# Patient Record
Sex: Female | Born: 1955 | Race: Black or African American | Hispanic: No | Smoking: Former smoker
Health system: Southern US, Community
[De-identification: ages and names within clinical notes are randomized; demographics above are authoritative.]

## PROBLEM LIST (undated history)

## (undated) DIAGNOSIS — I10 Essential (primary) hypertension: Secondary | ICD-10-CM

---

## 2002-08-09 DIAGNOSIS — Z90710 Acquired absence of both cervix and uterus: Secondary | ICD-10-CM | POA: Insufficient documentation

## 2008-09-29 DIAGNOSIS — I10 Essential (primary) hypertension: Secondary | ICD-10-CM | POA: Insufficient documentation

## 2008-09-29 DIAGNOSIS — F329 Major depressive disorder, single episode, unspecified: Secondary | ICD-10-CM | POA: Insufficient documentation

## 2013-02-21 DIAGNOSIS — M79673 Pain in unspecified foot: Secondary | ICD-10-CM | POA: Insufficient documentation

## 2013-11-09 DIAGNOSIS — M109 Gout, unspecified: Secondary | ICD-10-CM | POA: Insufficient documentation

## 2014-01-20 DIAGNOSIS — M545 Low back pain, unspecified: Secondary | ICD-10-CM | POA: Insufficient documentation

## 2014-01-20 DIAGNOSIS — G8929 Other chronic pain: Secondary | ICD-10-CM | POA: Insufficient documentation

## 2015-06-05 ENCOUNTER — Ambulatory Visit: Payer: Self-pay | Admitting: Podiatry

## 2015-06-08 DIAGNOSIS — J45909 Unspecified asthma, uncomplicated: Secondary | ICD-10-CM | POA: Insufficient documentation

## 2015-06-19 ENCOUNTER — Ambulatory Visit: Payer: Self-pay | Admitting: Podiatry

## 2015-06-26 ENCOUNTER — Encounter: Payer: Self-pay | Admitting: Podiatry

## 2015-06-26 ENCOUNTER — Ambulatory Visit: Payer: Self-pay

## 2015-07-03 NOTE — Progress Notes (Signed)
This encounter was created in error - please disregard.

## 2015-07-10 ENCOUNTER — Encounter: Payer: Self-pay | Admitting: Podiatry

## 2015-07-10 ENCOUNTER — Ambulatory Visit (INDEPENDENT_AMBULATORY_CARE_PROVIDER_SITE_OTHER): Payer: Federal, State, Local not specified - PPO | Admitting: Podiatry

## 2015-07-10 ENCOUNTER — Ambulatory Visit (INDEPENDENT_AMBULATORY_CARE_PROVIDER_SITE_OTHER): Payer: Federal, State, Local not specified - PPO

## 2015-07-10 VITALS — BP 111/68 | HR 83 | Resp 16

## 2015-07-10 DIAGNOSIS — M2011 Hallux valgus (acquired), right foot: Secondary | ICD-10-CM

## 2015-07-10 DIAGNOSIS — M205X1 Other deformities of toe(s) (acquired), right foot: Secondary | ICD-10-CM | POA: Diagnosis not present

## 2015-07-10 DIAGNOSIS — M79671 Pain in right foot: Secondary | ICD-10-CM

## 2015-07-10 NOTE — Patient Instructions (Signed)
Pre-Operative Instructions  Congratulations, you have decided to take an important step to improving your quality of life.  You can be assured that the doctors of [facility] will be with you every step of the way.  1. Plan to be at the surgery center/hospital at least 1 (one) hour prior to your scheduled time unless otherwise directed by the surgical center/hospital staff.  You must have a responsible adult accompany you, remain during the surgery and drive you home.  Make sure you have directions to the surgical center/hospital and know how to get there on time. 2. For hospital based surgery you will need to obtain a history and physical form from your family physician within 1 month prior to the date of surgery- we will give you a form for you primary physician.  3. We make every effort to accommodate the date you request for surgery.  There are however, times where surgery dates or times have to be moved.  We will contact you as soon as possible if a change in schedule is required.   4. No Aspirin/Ibuprofen for one week before surgery.  If you are on aspirin, any non-steroidal anti-inflammatory medications (Mobic, Aleve, Ibuprofen) you should stop taking it 7 days prior to your surgery.  You make take Tylenol  For pain prior to surgery.  5. Medications- If you are taking daily heart and blood pressure medications, seizure, reflux, allergy, asthma, anxiety, pain or diabetes medications, make sure the surgery center/hospital is aware before the day of surgery so they may notify you which medications to take or avoid the day of surgery. 6. No food or drink after midnight the night before surgery unless directed otherwise by surgical center/hospital staff. 7. No alcoholic beverages 24 hours prior to surgery.  No smoking 24 hours prior to or 24 hours after surgery. 8. Wear loose pants or shorts- loose enough to fit over bandages, boots, and casts. 9. No slip on shoes, sneakers are best. 10. Bring  your boot with you to the surgery center/hospital.  Also bring crutches or a walker if your physician has prescribed it for you.  If you do not have this equipment, it will be provided for you after surgery. 11. If you have not been contracted by the surgery center/hospital by the day before your surgery, call to confirm the date and time of your surgery. 12. Leave-time from work may vary depending on the type of surgery you have.  Appropriate arrangements should be made prior to surgery with your employer. 13. Prescriptions will be provided immediately following surgery by your doctor.  Have these filled as soon as possible after surgery and take the medication as directed. 14. Remove nail polish on the operative foot. 15. Wash the night before surgery.  The night before surgery wash the foot and leg well with the antibacterial soap provided and water paying special attention to beneath the toenails and in between the toes.  Rinse thoroughly with water and dry well with a towel.  Perform this wash unless told not to do so by your physician.  Enclosed: 1 Ice pack (please put in freezer the night before surgery)   1 Hibiclens skin cleaner   Pre-op Instructions  If you have any questions regarding the instructions, do not hesitate to call our office.  [place]: [address] , [place] [address] [phone]  [place]: [address] [place], [place] [address] [phone]  [place]: [address]  [place], [place] [address] [phone]  Dr. Richard   Tuchman DPM, Dr. Norman Regal DPM Dr. Richard Sikora DPM, Dr. M. Todd Hyatt DPM, Dr. Kathryn Egerton DPM 

## 2015-07-10 NOTE — Progress Notes (Signed)
   Subjective:    Patient ID: Sara Patterson, female    DOB: May 19, 1956, 59 y.o.   MRN: [id number]  HPI: She presents today 59 year old female with chief complaint of pain to the right first metatarsophalangeal joint. She states this been bothering me for several years and seems to be getting worse. It bothers her with shoe gear and states that it inhibits her ability to perform her daily activities.    Review of Systems  Musculoskeletal: Positive for back pain and arthralgias.  All other systems reviewed and are negative.      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 Pulses are strongly palpable bilateral. Neurologic sensorium is intact per Semmes-Weinstein monofilament. Deep tendon reflexes are intact bilateral and muscle strength +5 over 5 dorsiflexion plantar flexors and inverters and everters all intrinsic musculature is intact. Orthopedic evaluation and states all joints distal to the ankle held full range of motion without crepitation with exception of the first metatarsophalangeal joint of the right foot. At this point her first metatarsophalangeal joint is limited in dorsiflexion and she has pain on palpation around the joint. Radiographs taken today in the office demonstrates hallux limitus with dorsal spurring subchondral sclerosis joint space narrowing and a very irregular cartilaginous surface to the head of the first metatarsal. Cutaneous evaluation demonstrates supple well-hydrated cutis no erythema edema cellulitis drainage or odor.        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  Plan: Discussed etiology pathology conservative versus surgical therapies. We discussed appropriate shoe gear stretching exercise ice therapy shoe modifications and all for her  injection. She declined the injection and is requesting surgical intervention. At this point we went over a consent form today line by line number by number giving her ample time to ask questions she saw fit regarding a Keller arthroplasty with single silicone implant and grommets. I answered all the questions regarding this procedure to the best of my ability in layman's terms. She understood it was amenable to it and some of the fascia the consent form. We will also went over possible postoperative complications which may include but are not limited to postop pain bleeding swelling infection recurrence need for further surgery development of a DVT or pulmonary embolism she understands this. She has a cam walker at home which she will bring with her date of surgery. I will follow-up with her in the near future for surgical intervention.

## 2015-07-20 ENCOUNTER — Telehealth: Payer: Self-pay | Admitting: *Deleted

## 2015-07-20 NOTE — Telephone Encounter (Signed)
I'm the surgical coordinator.  Dr. Al Corpus wanted me to give you a call to schedule surgery.  "I'm sure he did.  Let's do it on December 9th."  Okay, that date is available.  Whenever you get a chance go ahead and register with the Surgical Center.  They will call you a day or two prior to surgery date.  They will give you the exact arrival time.  "Okay, thanks for calling.  Have a great weekend."  You as well.

## 2015-09-03 ENCOUNTER — Encounter (HOSPITAL_COMMUNITY): Payer: Self-pay | Admitting: Emergency Medicine

## 2015-09-03 ENCOUNTER — Emergency Department (HOSPITAL_COMMUNITY)
Admission: EM | Admit: 2015-09-03 | Discharge: 2015-09-03 | Disposition: A | Discharge status: Home / Self Care | Payer: Federal, State, Local not specified - PPO | Attending: Emergency Medicine | Admitting: Emergency Medicine

## 2015-09-03 ENCOUNTER — Emergency Department (HOSPITAL_COMMUNITY): Payer: Federal, State, Local not specified - PPO

## 2015-09-03 DIAGNOSIS — Z79899 Other long term (current) drug therapy: Secondary | ICD-10-CM | POA: Diagnosis not present

## 2015-09-03 DIAGNOSIS — Z87891 Personal history of nicotine dependence: Secondary | ICD-10-CM | POA: Insufficient documentation

## 2015-09-03 DIAGNOSIS — Z7951 Long term (current) use of inhaled steroids: Secondary | ICD-10-CM | POA: Insufficient documentation

## 2015-09-03 DIAGNOSIS — R109 Unspecified abdominal pain: Secondary | ICD-10-CM | POA: Insufficient documentation

## 2015-09-03 DIAGNOSIS — I10 Essential (primary) hypertension: Secondary | ICD-10-CM | POA: Diagnosis not present

## 2015-09-03 HISTORY — DX: Essential (primary) hypertension: I10

## 2015-09-03 LAB — URINALYSIS, ROUTINE W REFLEX MICROSCOPIC
BILIRUBIN URINE: NEGATIVE
Glucose, UA: NEGATIVE mg/dL
HGB URINE DIPSTICK: NEGATIVE
Ketones, ur: NEGATIVE mg/dL
Leukocytes, UA: NEGATIVE
NITRITE: NEGATIVE
PROTEIN: NEGATIVE mg/dL
SPECIFIC GRAVITY, URINE: 1.005 (ref 1.005–1.030)
UROBILINOGEN UA: 0.2 mg/dL (ref 0.0–1.0)
pH: 7 (ref 5.0–8.0)

## 2015-09-03 LAB — BASIC METABOLIC PANEL
Anion gap: 7 (ref 5–15)
BUN: 15 mg/dL (ref 6–20)
CHLORIDE: 102 mmol/L (ref 101–111)
CO2: 29 mmol/L (ref 22–32)
CREATININE: 0.82 mg/dL (ref 0.44–1.00)
Calcium: 9.1 mg/dL (ref 8.9–10.3)
Glucose, Bld: 108 mg/dL — ABNORMAL HIGH (ref 65–99)
Potassium: 3.7 mmol/L (ref 3.5–5.1)
SODIUM: 138 mmol/L (ref 135–145)

## 2015-09-03 LAB — CBC
HCT: 37.8 % (ref 36.0–46.0)
Hemoglobin: 12.7 g/dL (ref 12.0–15.0)
MCH: 29.4 pg (ref 26.0–34.0)
MCHC: 33.6 g/dL (ref 30.0–36.0)
MCV: 87.5 fL (ref 78.0–100.0)
PLATELETS: 192 10*3/uL (ref 150–400)
RBC: 4.32 MIL/uL (ref 3.87–5.11)
RDW: 13.1 % (ref 11.5–15.5)
WBC: 4.3 10*3/uL (ref 4.0–10.5)

## 2015-09-03 MED ORDER — MORPHINE SULFATE (PF) 4 MG/ML IV SOLN
4.0000 mg | Freq: Once | INTRAVENOUS | Status: AC
  Administered 2015-09-03: 4 mg via INTRAVENOUS
  Filled 2015-09-03: qty 1

## 2015-09-03 MED ORDER — KETOROLAC TROMETHAMINE 30 MG/ML IJ SOLN
30.0000 mg | Freq: Once | INTRAMUSCULAR | Status: AC
  Administered 2015-09-03: 30 mg via INTRAVENOUS
  Filled 2015-09-03: qty 1

## 2015-09-03 MED ORDER — IOHEXOL 300 MG/ML  SOLN
100.0000 mL | Freq: Once | INTRAMUSCULAR | Status: AC | PRN
  Administered 2015-09-03: 100 mL via INTRAVENOUS

## 2015-09-03 MED ORDER — SODIUM CHLORIDE 0.9 % IV SOLN
1000.0000 mL | Freq: Once | INTRAVENOUS | Status: AC
  Administered 2015-09-03: 1000 mL via INTRAVENOUS

## 2015-09-03 MED ORDER — ONDANSETRON HCL 4 MG/2ML IJ SOLN
4.0000 mg | Freq: Once | INTRAMUSCULAR | Status: AC
  Administered 2015-09-03: 4 mg via INTRAVENOUS
  Filled 2015-09-03: qty 2

## 2015-09-03 MED ORDER — OXYCODONE-ACETAMINOPHEN 5-325 MG PO TABS: 1.0000 | ORAL_TABLET | ORAL | Status: DC | PRN

## 2015-09-03 MED ORDER — IOHEXOL 300 MG/ML  SOLN
25.0000 mL | Freq: Once | INTRAMUSCULAR | Status: AC | PRN
  Administered 2015-09-03: 25 mL via ORAL

## 2015-09-03 NOTE — ED Notes (Signed)
Per pt states right flank pain with no urinary symptoms since Wednesday

## 2015-09-03 NOTE — ED Notes (Signed)
Patient transported to CT 

## 2015-09-03 NOTE — ED Notes (Signed)
Bed: WA07 Expected date:  Expected time:  Means of arrival:  Comments: 

## 2015-09-03 NOTE — ED Provider Notes (Signed)
CSN: [id number]     Arrival date & time 09/03/15  1038 History   First MD Initiated Contact with Patient 09/03/15 1108     Chief Complaint  Patient presents with  . Flank Pain     (Consider location/radiation/quality/duration/timing/severity/associated sxs/prior Treatment) HPI Sara Patterson This 59 year old female who presents emergency Department with chief complaint of right flank pain. Patient states that symptoms began 5 days ago. However, she has been dealing with significant familial stressors and a sister who died. Patient states that her pain is constant, colicky, currently 8 out of 10, nothing seems to make it worse or better. She has no urinary symptoms or history of kidney stone. She denies fevers, chills, myalgias or other signs of systemic infection. Past Medical History  Diagnosis Date  . Hypertension    History reviewed. No pertinent past surgical history. No family history on file. Social History  Substance Use Topics  . Smoking status: Former Games developer  . Smokeless tobacco: None  . Alcohol Use: 0.0 oz/week    0 Standard drinks or equivalent per week   OB History    No data available     Review of Systems  Ten systems reviewed and are negative for acute change, except as noted in the HPI.    Allergies  Review of patient's allergies indicates no known allergies.  Home Medications   Prior to Admission medications   Medication Sig Start Date End Date Taking? Authorizing Provider  albuterol (PROVENTIL HFA;VENTOLIN HFA) 108 (90 BASE) MCG/ACT inhaler Inhale 1-2 puffs into the lungs every 6 (six) hours as needed for wheezing or shortness of breath.   Yes Historical Provider, MD  ALPRAZolam Prudy Feeler) 0.5 MG tablet Take 0.25 mg by mouth 2 (two) times daily as needed for anxiety.  07/23/15  Yes Historical Provider, MD  buPROPion (WELLBUTRIN XL) 150 MG 24 hr tablet Take 150 mg by mouth daily.  02/16/15  Yes Historical Provider, MD  cetirizine (ZYRTEC) 10 MG tablet Take 10 mg  by mouth daily.  06/08/15  Yes Historical Provider, MD  cyclobenzaprine (FLEXERIL) 10 MG tablet Take 10 mg by mouth 3 (three) times daily as needed for muscle spasms.  08/20/15  Yes Historical Provider, MD  FLUoxetine (PROZAC) 20 MG capsule Take 20 mg by mouth daily.  02/16/15  Yes Historical Provider, MD  Fluticasone Propionate, Inhal, (FLOVENT IN) Inhale 1 puff into the lungs daily.   Yes Historical Provider, MD  hydrochlorothiazide (HYDRODIURIL) 25 MG tablet Take 25 mg by mouth daily.  02/16/15  Yes Historical Provider, MD  JUBLIA 10 % SOLN Apply 1 application topically as needed (fungus). Fungus 08/28/15  Yes Historical Provider, MD  lisinopril (PRINIVIL,ZESTRIL) 10 MG tablet Take 10 mg by mouth daily.  02/16/15  Yes Historical Provider, MD  naproxen (NAPROSYN) 500 MG tablet Take 500 mg by mouth 2 (two) times daily as needed for mild pain.  08/20/15  Yes Historical Provider, MD  oxyCODONE-acetaminophen (PERCOCET) 5-325 MG tablet Take 1-2 tablets by mouth every 4 (four) hours as needed. 09/03/15   Tammie Yanda, PA-C   BP 128/69 mmHg  Pulse 76  Temp(Src) 97.8 F (36.6 C) (Oral)  Resp 16  SpO2 100% Physical Exam  Constitutional: She is oriented to person, place, and time. She appears well-developed and well-nourished. No distress.  HENT:  Head: Normocephalic and atraumatic.  Eyes: Conjunctivae are normal. No scleral icterus.  Neck: Normal range of motion.  Cardiovascular: Normal rate, regular rhythm and normal heart sounds.  Exam reveals no gallop  and no friction rub.   No murmur heard. Pulmonary/Chest: Effort normal and breath sounds normal. No respiratory distress.  Abdominal: Soft. Bowel sounds are normal. She exhibits no distension and no mass. There is tenderness (R CVA TENDERNESS). There is no guarding.  Neurological: She is alert and oriented to person, place, and time.  Skin: Skin is warm and dry. She is not diaphoretic.  Nursing note and vitals reviewed.   ED Course  Procedures  (including critical care time) Labs Review Labs Reviewed  BASIC METABOLIC PANEL - Abnormal; Notable for the following:    Glucose, Bld 108 (*)    All other components within normal limits  URINALYSIS, ROUTINE W REFLEX MICROSCOPIC (NOT AT [facility])  CBC    Imaging Review Ct Abdomen Pelvis W Contrast  09/03/2015  CLINICAL DATA:  Right flank pain EXAM: CT ABDOMEN AND PELVIS WITH CONTRAST TECHNIQUE: Multidetector CT imaging of the abdomen and pelvis was performed using the standard protocol following bolus administration of intravenous contrast. CONTRAST:  100mL OMNIPAQUE IOHEXOL 300 MG/ML  SOLN COMPARISON:  None FINDINGS: The lung bases are unremarkable. Sagittal images of the spine shows degenerative changes lower thoracic spine. Enhanced liver is unremarkable. There is a low density lesion within spleen with ring like peripheral calcification probable a cyst measures 2.5 cm The pancreas and adrenal glands are unremarkable. Kidneys are symmetrical in size and enhancement. No hydronephrosis or hydroureter. No aortic aneurysm. Moderate stool noted throughout the colon. No small bowel obstruction. No ascites or free air. No adenopathy. There is no pericecal inflammation. Normal appendix. There is a distended urinary bladder. No calcified calculi are noted within urinary bladder. The uterus and adnexa are not identified. Moderate stool noted within rectum. No inguinal adenopathy. No destructive bony lesions are noted within pelvis. Delayed renal images shows bilateral renal symmetrical excretion. IMPRESSION: 1. There is no acute inflammatory process within abdomen or pelvis. 2. Low-density lesion with peripheral calcification within spleen measures 2.5 cm probable is cyst. 3. Normal appendix. No pericecal inflammation. Moderate stool throughout the colon. 4. There is a distended urinary bladder. No calcified calculi are noted within urinary bladder. 5. The uterus and adnexa not identified probable surgically  absent. 6. No small bowel obstruction.  No hydronephrosis or hydroureter. Electronically Signed   By: Natasha MeadLiviu  Pop M.D.   On: 09/03/2015 13:59   I have personally reviewed and evaluated these images and lab results as part of my medical decision-making.   EKG Interpretation None      MDM   Final diagnoses:  Right flank pain  .3:16 PM Filed Vitals:   09/03/15 1050 09/03/15 1304 09/03/15 1437  BP: 123/68 132/64 128/69  Pulse: 93 65 76  Temp: 97.8 F (36.6 C)  97.8 F (36.6 C)  TempSrc: Oral  Oral  Resp: 18 17 16   SpO2: 99% 100% 100%    Patient here with complaint of flank pain. No abnormalities on lab work. 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 However, there is no acute abnormality to account for her pain.  Pain is not reproducible. She is not having pleuritic pain.Pain is controlled with pain medication.. Labs are unremarkable. Patient states that splenic lesion was noted on a previous scan. Will, dc with pain medication.patient is to f/u with her pcp asap.   Arthor Captainbigail Jream Broyles, PA-C 09/07/15 [id number]  Raeford RazorStephen Kohut, MD 09/10/15 [phone]

## 2015-09-03 NOTE — Discharge Instructions (Signed)
Tramadol cannot be taken with your antidepressant medication.  I have prescribed a small amount of percocet for relief of severe pain.  Flank Pain Flank pain refers to pain that is located on the side of the body between the upper abdomen and the back. The pain may occur over a short period of time (acute) or may be long-term or reoccurring (chronic). It may be mild or severe. Flank pain can be caused by many things. CAUSES  Some of the more common causes of flank pain include:  Muscle strains.   Muscle spasms.   A disease of your spine (vertebral disk disease).   A lung infection (pneumonia).   Fluid around your lungs (pulmonary edema).   A kidney infection.   Kidney stones.   A very painful skin rash caused by the chickenpox virus (shingles).   Gallbladder disease.  HOME CARE INSTRUCTIONS  Home care will depend on the cause of your pain. In general,  Rest as directed by your caregiver.  Drink enough fluids to keep your urine clear or pale yellow.  Only take over-the-counter or prescription medicines as directed by your caregiver. Some medicines may help relieve the pain.  Tell your caregiver about any changes in your pain.  Follow up with your caregiver as directed. SEEK IMMEDIATE MEDICAL CARE IF:   Your pain is not controlled with medicine.   You have new or worsening symptoms.  Your pain increases.   You have abdominal pain.   You have shortness of breath.   You have persistent nausea or vomiting.   You have swelling in your abdomen.   You feel faint or pass out.   You have blood in your urine.  You have a fever or persistent symptoms for more than 2-3 days.  You have a fever and your symptoms suddenly get worse. MAKE SURE YOU:   Understand these instructions.  Will watch your condition.  Will get help right away if you are not doing well or get worse.   This information is not intended to replace advice given to you by your  health care provider. Make sure you discuss any questions you have with your health care provider.   Document Released: 12/18/2005 Document Revised: 07/21/2012 Document Reviewed: 06/10/2012 Elsevier Interactive Patient Education [employer]2016 [employer] Inc.

## 2015-10-18 ENCOUNTER — Other Ambulatory Visit: Payer: Self-pay | Admitting: Podiatry

## 2015-10-18 MED ORDER — OXYCODONE-ACETAMINOPHEN 10-325 MG PO TABS: ORAL_TABLET | ORAL | Status: DC

## 2015-10-18 MED ORDER — CEPHALEXIN 500 MG PO CAPS: 500.0000 mg | ORAL_CAPSULE | Freq: Three times a day (TID) | ORAL | Status: DC

## 2015-10-18 MED ORDER — PROMETHAZINE HCL 25 MG PO TABS: 25.0000 mg | ORAL_TABLET | Freq: Three times a day (TID) | ORAL | Status: DC | PRN

## 2015-10-19 ENCOUNTER — Encounter: Payer: Self-pay | Admitting: Podiatry

## 2015-10-19 DIAGNOSIS — M205X1 Other deformities of toe(s) (acquired), right foot: Secondary | ICD-10-CM | POA: Diagnosis not present

## 2015-10-19 DIAGNOSIS — M2011 Hallux valgus (acquired), right foot: Secondary | ICD-10-CM | POA: Diagnosis not present

## 2015-10-22 ENCOUNTER — Telehealth: Payer: Self-pay | Admitting: *Deleted

## 2015-10-22 NOTE — Telephone Encounter (Signed)
Pt states she is having problem with her surgery boot, it rubs across the suture line.  I told pt to come in and we would refit her boot.  Pt states she will be in on Wednesday.

## 2015-10-25 ENCOUNTER — Ambulatory Visit (INDEPENDENT_AMBULATORY_CARE_PROVIDER_SITE_OTHER): Payer: Federal, State, Local not specified - PPO | Admitting: Podiatry

## 2015-10-25 ENCOUNTER — Encounter: Payer: Self-pay | Admitting: Podiatry

## 2015-10-25 ENCOUNTER — Ambulatory Visit (INDEPENDENT_AMBULATORY_CARE_PROVIDER_SITE_OTHER): Payer: Federal, State, Local not specified - PPO

## 2015-10-25 VITALS — BP 137/78 | HR 97 | Resp 16

## 2015-10-25 DIAGNOSIS — Z9889 Other specified postprocedural states: Secondary | ICD-10-CM | POA: Diagnosis not present

## 2015-10-25 DIAGNOSIS — M205X1 Other deformities of toe(s) (acquired), right foot: Secondary | ICD-10-CM | POA: Diagnosis not present

## 2015-10-25 MED ORDER — HYDROXYZINE HCL 25 MG PO TABS: 25.0000 mg | ORAL_TABLET | Freq: Three times a day (TID) | ORAL | Status: AC | PRN

## 2015-10-25 MED ORDER — OXYCODONE-ACETAMINOPHEN 10-325 MG PO TABS: ORAL_TABLET | ORAL | Status: DC

## 2015-10-28 NOTE — Progress Notes (Signed)
She presents today for her first postop visit that is post Lorenz CoasterKeller arthroplasty with a single silicone implant and grommets right foot. She denies fever chills nausea vomiting muscle aches and pains area she states that the foot is moderately tender. She states that she fell last week but was able to keep the foot in the air as she injured her left knee. She states that she did not hit the right foot.  She states that she has been performing exercises to her toes to keep them moving.  Objective: Vital signs are stable she is alert and oriented 3. Dry sterile dressing intact reveals some serosanguineous drainage at the distal most aspect of the incision site. The incision site is somewhat macerated distally but is not open. She has plantarflexion of the first metatarsophalangeal joint and hallux. Either due to a extensor hallucis longus tendon tear or the edema I am unable to palpate the extensor hallucis longus tendon. I'm concerned that this tendon may be torn or partially torn resulting in the plantar flexion of the joint. This tendon had an area of fraying intraoperatively which I try to oversew and repair. radiographically the Lorenz CoasterKeller appears to be in good position with first metatarsophalangeal joint in good alignment and the implant intact. Radiographs were reviewed today with the patient as well as.  Assessment: one-week status post L arthroplasty with a single silicone implant. Possible tear or rupture of the extensor hallucis longus tendon right.   Plan: we redressed today with a dry sterile compressive dressing. I would like to get rid of the edema on the dorsal aspect of the foot to better evaluate the musculature and tendon that is still intact. I encouraged range of motion exercises I will follow up with her in 1 week for reevaluation. It maybe necessary to go back for a EHL evaluation and repair repair if  Needed if this has not resolved.

## 2015-11-01 ENCOUNTER — Ambulatory Visit (INDEPENDENT_AMBULATORY_CARE_PROVIDER_SITE_OTHER): Payer: Federal, State, Local not specified - PPO | Admitting: Podiatry

## 2015-11-01 ENCOUNTER — Encounter: Payer: Self-pay | Admitting: Podiatry

## 2015-11-01 VITALS — BP 112/62 | HR 88 | Resp 16

## 2015-11-01 DIAGNOSIS — M205X1 Other deformities of toe(s) (acquired), right foot: Secondary | ICD-10-CM

## 2015-11-01 DIAGNOSIS — Z9889 Other specified postprocedural states: Secondary | ICD-10-CM

## 2015-11-05 ENCOUNTER — Other Ambulatory Visit: Payer: Self-pay | Admitting: Podiatry

## 2015-11-05 MED ORDER — PROMETHAZINE HCL 25 MG PO TABS: 25.0000 mg | ORAL_TABLET | Freq: Three times a day (TID) | ORAL | Status: DC | PRN

## 2015-11-05 MED ORDER — HYDROMORPHONE HCL 4 MG PO TABS: ORAL_TABLET | ORAL | Status: DC

## 2015-11-05 MED ORDER — CEPHALEXIN 500 MG PO CAPS: 500.0000 mg | ORAL_CAPSULE | Freq: Three times a day (TID) | ORAL | Status: DC

## 2015-11-05 NOTE — Progress Notes (Signed)
She presents today for a follow-up of her Lorenz CoasterKeller arthroplasty with a single silicone implant. She states that it feels much better however still have no motion or dorsiflexion of the deep toe on the right foot. She states the swelling has gone down a lot.  Objective: Vital signs are stable she is alert and oriented 3. Swelling has reduced considerably there is no erythema saline as drainage or odor. Margins are well coapted at this point. She has no appreciable extension at the level of the metatarsophalangeal joint. This indicates to me that there is a tear or an attenuation of her extensor tendon. I am able to feel the extensor hallucis longus muscle fire when she tries to move the toe so I do not feel that this is a muscle problem either associated with nerve block or tourniquet. I do think that this should give back to the operating room for exploration.  Assessment: Well-healing Keller arthroplasty with a single silicone implant with great range of motion. 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  Plan: She was consented today for a surgical exploration and repair if necessary extensor hallucis longus tendon right foot. I will follow-up with her next Thursday. We went over the consent form today line by line number by number giving her ample time to ask questions she socket regarding this procedure I answered all of them to the best of my ability in layman's terms. She understands this and is amenable to it and she is educated and medicine. We did discuss the possible postop complications which may include but are not limited to postop pain bleeding swelling infection recurrence need for further surgery loss of the ability to extend the hallux loss of digit loss of limb loss of life. She will return be placed in a brace or Cam Walker at the day of surgery.

## 2015-11-06 NOTE — Progress Notes (Signed)
DOS 10/19/2015 - Right Keller Bunion Implant.

## 2015-11-08 ENCOUNTER — Encounter: Payer: Self-pay | Admitting: Podiatry

## 2015-11-08 DIAGNOSIS — M205X1 Other deformities of toe(s) (acquired), right foot: Secondary | ICD-10-CM | POA: Diagnosis not present

## 2015-11-15 ENCOUNTER — Encounter: Payer: Self-pay | Admitting: Podiatry

## 2015-11-15 ENCOUNTER — Ambulatory Visit (INDEPENDENT_AMBULATORY_CARE_PROVIDER_SITE_OTHER): Payer: Federal, State, Local not specified - PPO | Admitting: Podiatry

## 2015-11-15 VITALS — BP 113/72 | HR 85 | Temp 96.6°F | Resp 16

## 2015-11-15 DIAGNOSIS — Z9889 Other specified postprocedural states: Secondary | ICD-10-CM

## 2015-11-15 MED ORDER — HYDROMORPHONE HCL 4 MG PO TABS: ORAL_TABLET | ORAL | Status: DC

## 2015-11-15 NOTE — Progress Notes (Signed)
She presents today after revision right foot. Extensor hallucis longus tendon repair right. She states that she is doing pretty well however she is tired of the cast.  Objective: Vital signs are stable she is alert and oriented 3. She is able to move her toes with dorsiflexion and plantarflexion without complication. The cast is intact there is no posterior knee pain no shortness of breath.  Assessment: Well-healing surgical foot right.  Plan: Removal of the cast next week possible application of another cast.

## 2015-11-22 ENCOUNTER — Ambulatory Visit (INDEPENDENT_AMBULATORY_CARE_PROVIDER_SITE_OTHER): Payer: Federal, State, Local not specified - PPO | Admitting: Podiatry

## 2015-11-22 DIAGNOSIS — Z9889 Other specified postprocedural states: Secondary | ICD-10-CM

## 2015-11-22 MED ORDER — IBUPROFEN 800 MG PO TABS: 800.0000 mg | ORAL_TABLET | Freq: Three times a day (TID) | ORAL | Status: AC | PRN

## 2015-11-22 MED ORDER — HYDROMORPHONE HCL 4 MG PO TABS: ORAL_TABLET | ORAL | Status: DC

## 2015-11-22 NOTE — Progress Notes (Signed)
She presents today for cast change right lower extremity. Cast was removed demonstrates marginal coapted dorsal aspect of the right foot she has some range of motion of the first metatarsophalangeal joint daily dorsiflexion.  Objective: Vital signs are stable she is alert and oriented 3 sutures are intact marginal coapted no erythema and mild edema no cellulitis drainage or odor she has still has a plantarflexed first metatarsal phalangeal joint but does have dorsiflexion at the level of the first metatarsophalangeal joint.  Assessment: Healing surgical foot sutures were removed today.  Plan: Edges were removed today a new dry sterile compressive fiberglass cast was applied. Follow up with her in 2 weeks for cast removal.

## 2015-11-29 ENCOUNTER — Encounter: Payer: Self-pay | Admitting: Podiatry

## 2015-11-29 ENCOUNTER — Ambulatory Visit (INDEPENDENT_AMBULATORY_CARE_PROVIDER_SITE_OTHER): Payer: Federal, State, Local not specified - PPO | Admitting: Podiatry

## 2015-11-29 VITALS — BP 127/86 | HR 69 | Resp 12

## 2015-11-29 DIAGNOSIS — Z9889 Other specified postprocedural states: Secondary | ICD-10-CM

## 2015-12-01 NOTE — Progress Notes (Signed)
She presents today with a chief complaint of a painful cast right lower extremity status post Keller arthroplasty with a single silicone implant complicated by a tear of her extensor hallucis longus tendon which was repaired one week later. She states that the cast is rubbing her shin and is painful. She denies any trauma to the foot. Plantar aspect of the cast appears to be flattened.  Objective: Cast did not appear to be too tight however it did appear to rub the shin. No open lesions or wounds.  Assessment: Cast discomfort.  Plan: Bivalved the cast today. She will remove the cast at home and I will follow up with her in 1 week for evaluation. She will apply her Cam Dan Humphreys and utilize that just as though it would be a cast.

## 2015-12-06 ENCOUNTER — Encounter: Payer: Federal, State, Local not specified - PPO | Admitting: Podiatry

## 2015-12-13 ENCOUNTER — Encounter: Payer: Self-pay | Admitting: Podiatry

## 2015-12-13 ENCOUNTER — Ambulatory Visit (INDEPENDENT_AMBULATORY_CARE_PROVIDER_SITE_OTHER): Payer: Federal, State, Local not specified - PPO

## 2015-12-13 ENCOUNTER — Ambulatory Visit (INDEPENDENT_AMBULATORY_CARE_PROVIDER_SITE_OTHER): Payer: Federal, State, Local not specified - PPO | Admitting: Podiatry

## 2015-12-13 VITALS — BP 127/70 | HR 84 | Resp 12

## 2015-12-13 DIAGNOSIS — M205X1 Other deformities of toe(s) (acquired), right foot: Secondary | ICD-10-CM

## 2015-12-13 DIAGNOSIS — Z9889 Other specified postprocedural states: Secondary | ICD-10-CM

## 2015-12-13 MED ORDER — HYDROMORPHONE HCL 4 MG PO TABS: ORAL_TABLET | ORAL | Status: DC

## 2015-12-13 NOTE — Progress Notes (Signed)
She presents today for follow-up of her extensor hallucis longus tendon repair right foot. She states this seems to be doing much better. She continues to utilize her knee scooter and her CAM boot.  Objective: Vital signs are stable she is alert and oriented 3 incision is going to heal uneventfully minimal edema and no erythema cellulitis drainage or odor. The toe is rectus in nature. She is good range of motion to flexion and plantar flexion passive and active. I encouraged her to limit her dorsiflexion until this has completely resolved. I do feel that the extensor hallucis longus tendon is working and it does move the toe dorsally approximately 10. This is enough to get into a pair of shoes.  Assessment: Well-healed surgical foot right.  Plan: I will allow her to start ambulating in her cam walker performing massage therapy to the foot we also dispensed a Darco shoe and a compression anklet. Follow up with her in 2-3 weeks

## 2015-12-25 IMAGING — CT CT ABD-PELV W/ CM
2 of 5 series · 16 of 46 positions shown, 18 images · IV contrast (omnipaque)
Comparison: None

CLINICAL DATA: Right flank pain

EXAM:
CT ABDOMEN AND PELVIS WITH CONTRAST
TECHNIQUE: Multidetector CT imaging of the abdomen and pelvis was performed
using the standard protocol following bolus administration of
intravenous contrast.
CONTRAST:  100mL OMNIPAQUE IOHEXOL 300 MG/ML  SOLN

[Series 2: abd/pel with · axial · 0.79mm/px · z∈[+1111,+1496]mm · 13 of 87 slices shown, 15 images]
[im 5/87  soft-tissue]
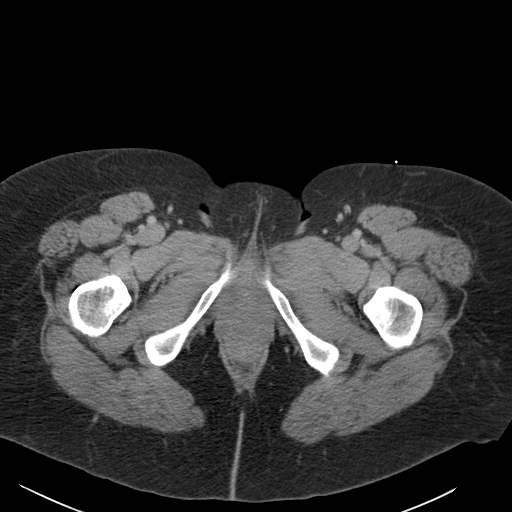
[im 5/87  bone]
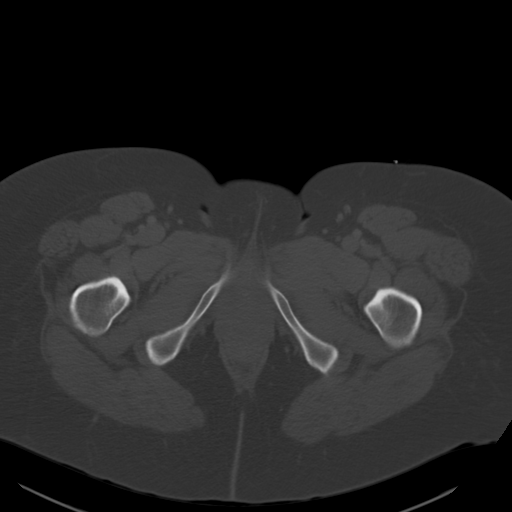
[im 13/87  soft-tissue]
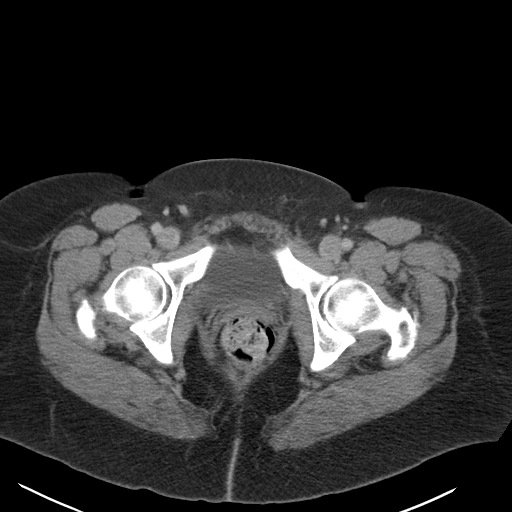
[im 18/87  soft-tissue]
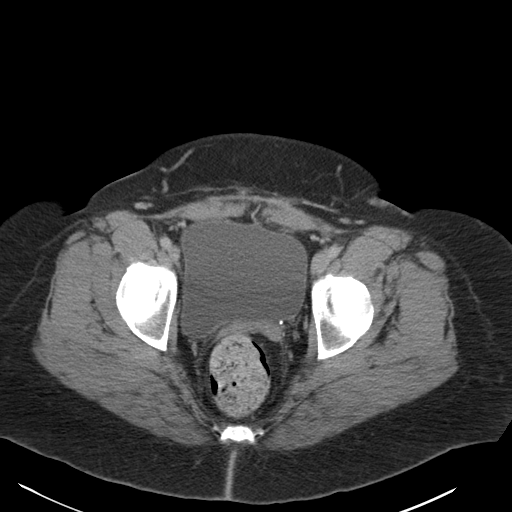
[im 26/87  soft-tissue]
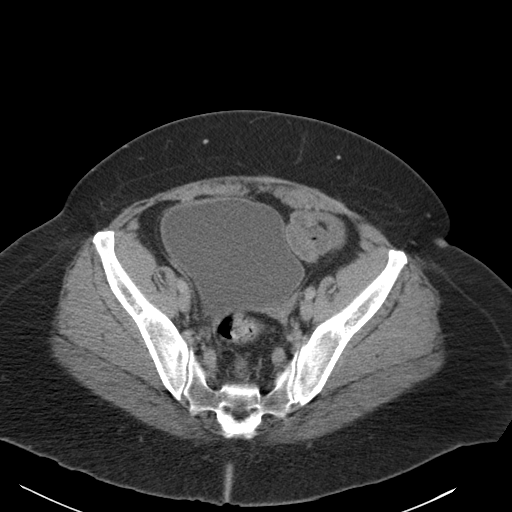
[im 31/87  soft-tissue]
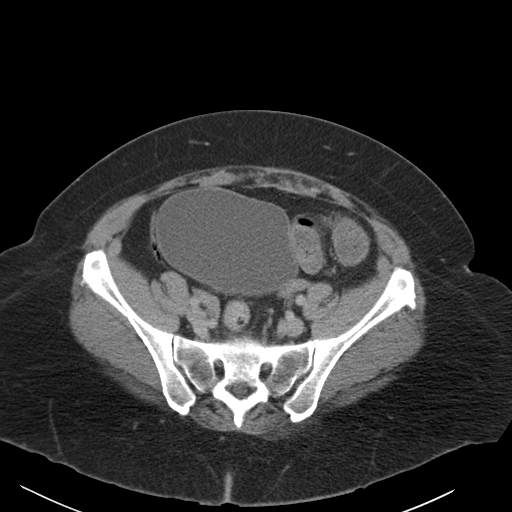
[im 39/87  soft-tissue]
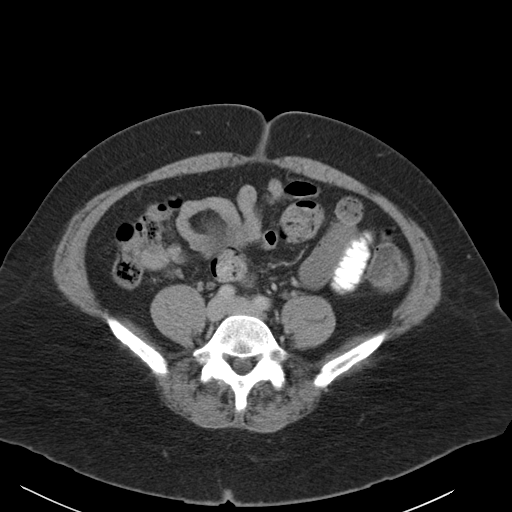
[im 44/87  soft-tissue]
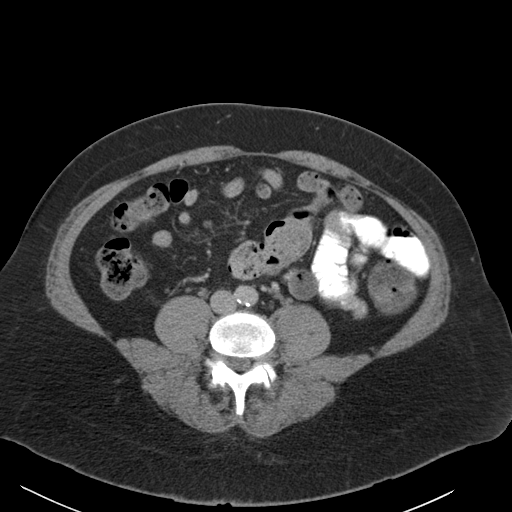
[im 48/87  soft-tissue]
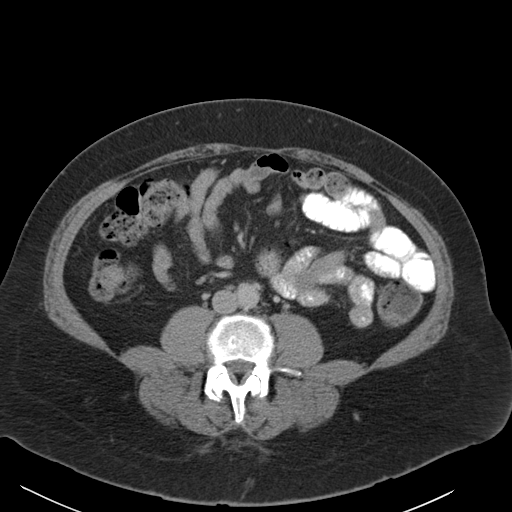
[im 56/87  soft-tissue]
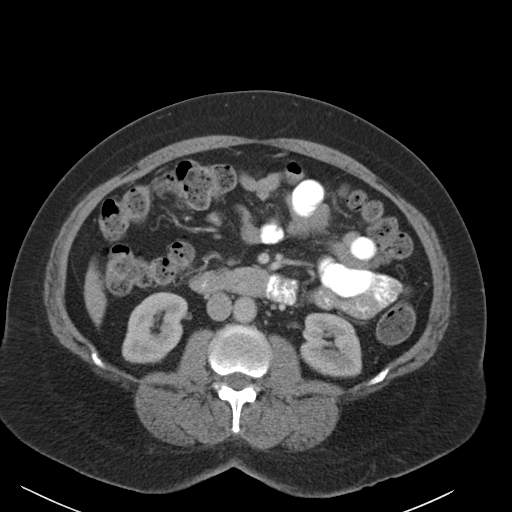
[im 56/87  bone]
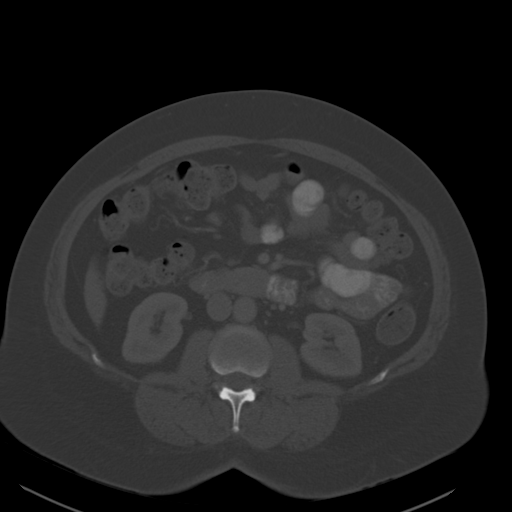
[im 61/87  soft-tissue]
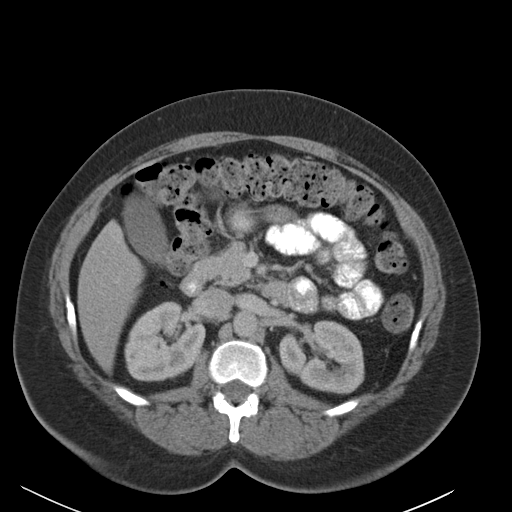
[im 69/87  soft-tissue]
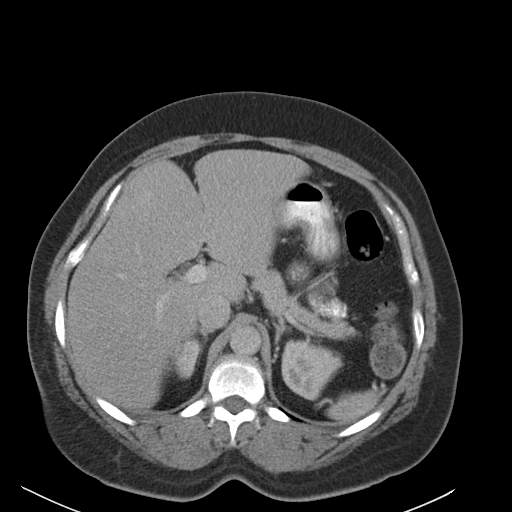
[im 74/87  soft-tissue]
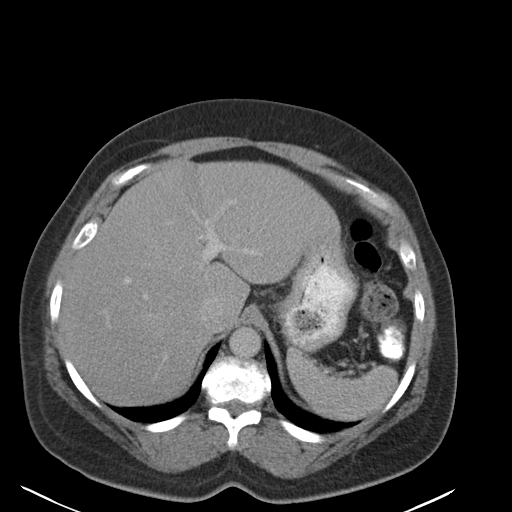
[im 82/87  soft-tissue]
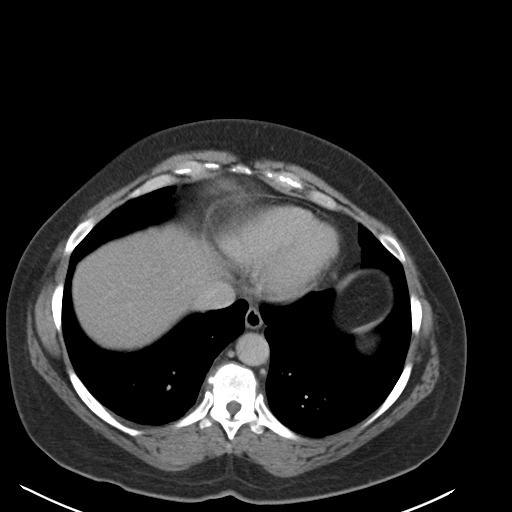

[Series 3: coronal a/|p · coronal · 0.95mm/px · 3 of 83 slices shown]
[im 28/83  soft-tissue]
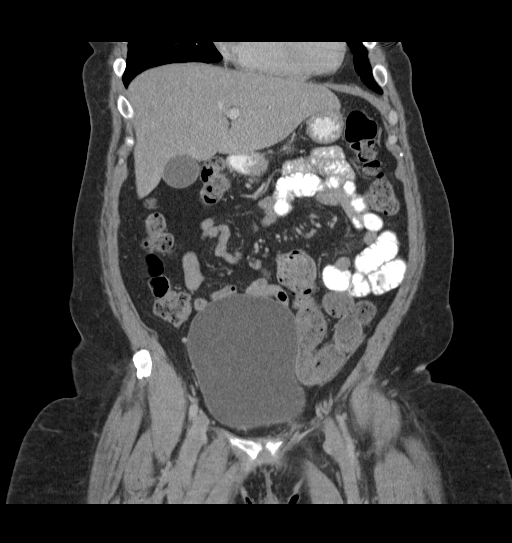
[im 37/83  soft-tissue]
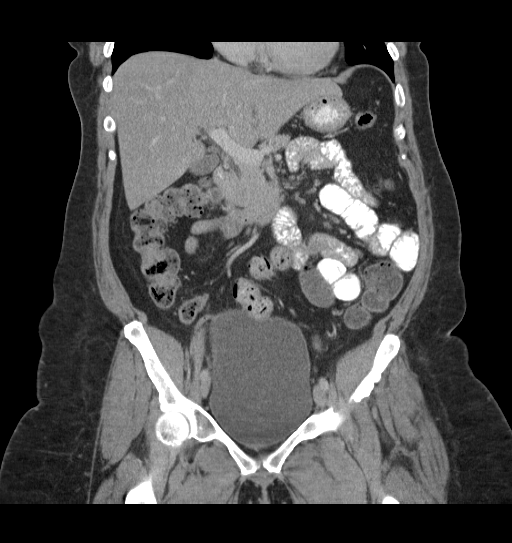
[im 46/83  soft-tissue]
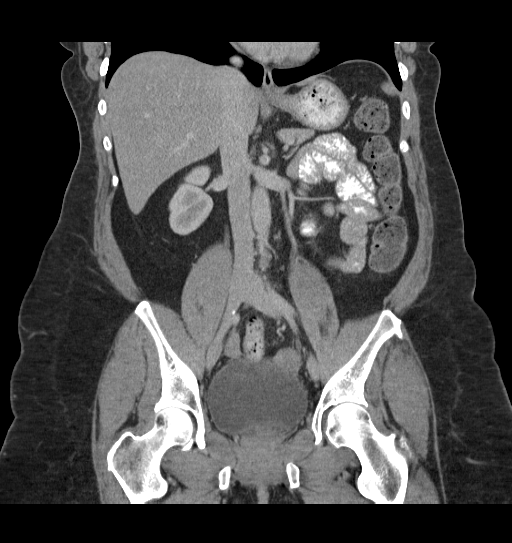

[16 of 46 positions shown; findings below may reference images not displayed]

FINDINGS: The lung bases are unremarkable. Sagittal images of the spine shows
degenerative changes lower thoracic spine. Enhanced liver is
unremarkable. There is a low density lesion within spleen with ring
like peripheral calcification probable a cyst measures 2.5 cm

The pancreas and adrenal glands are unremarkable. Kidneys are
symmetrical in size and enhancement. No hydronephrosis or
hydroureter. No aortic aneurysm.

Moderate stool noted throughout the colon. No small bowel
obstruction. No ascites or free air. No adenopathy.

There is no pericecal inflammation. Normal appendix. There is a
distended urinary bladder. No calcified calculi are noted within
urinary bladder. The uterus and adnexa are not identified. Moderate
stool noted within rectum. No inguinal adenopathy. No destructive
bony lesions are noted within pelvis.

Delayed renal images shows bilateral renal symmetrical excretion.
IMPRESSION: 1. There is no acute inflammatory process within abdomen or pelvis.
2. Low-density lesion with peripheral calcification within spleen
measures 2.5 cm probable is cyst.
3. Normal appendix. No pericecal inflammation. Moderate stool
throughout the colon.
4. There is a distended urinary bladder. No calcified calculi are
noted within urinary bladder.
5. The uterus and adnexa not identified probable surgically absent.
6. No small bowel obstruction.  No hydronephrosis or hydroureter.

## 2015-12-27 ENCOUNTER — Ambulatory Visit (INDEPENDENT_AMBULATORY_CARE_PROVIDER_SITE_OTHER): Payer: Federal, State, Local not specified - PPO

## 2015-12-27 ENCOUNTER — Ambulatory Visit (INDEPENDENT_AMBULATORY_CARE_PROVIDER_SITE_OTHER): Payer: Federal, State, Local not specified - PPO | Admitting: Podiatry

## 2015-12-27 DIAGNOSIS — Z9889 Other specified postprocedural states: Secondary | ICD-10-CM

## 2015-12-27 DIAGNOSIS — M205X1 Other deformities of toe(s) (acquired), right foot: Secondary | ICD-10-CM

## 2015-12-27 NOTE — Progress Notes (Signed)
She presents today for follow-up of her surgical foot right. 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 She utilizes her cam walker the majority of the time however she uses a sandal while at home. She states that her foot still swells and recently she felt a pop and heard a pop in the operative foot. She states that she now has pain and she points to the third metatarsal right foot.  Objective: Vital signs are stable she is alert and oriented 3 she has great range of motion of the first metatarsophalangeal joint and edema is decreasing. She has pain on palpation with mild edema overlying the third metatarsophalangeal joint head and demonstrates on radiograph and nondisplaced fracture of the head of the third metatarsal just past the neck.  Assessment: Nondisplaced fracture third metatarsal surgical neck right foot. Well-healing surgical foot otherwise.  Plan: I encouraged her to get into her Darco shoe but not walk barefoot because of the fracture. Encouraged range of motion exercises and swell as simple physical therapy. We will follow-up with her in the near future for reevaluation approximately 2 weeks. Remember to ask her how her conference in [place] was.

## 2015-12-28 ENCOUNTER — Telehealth: Payer: Self-pay | Admitting: *Deleted

## 2015-12-28 MED ORDER — HYDROCODONE-ACETAMINOPHEN 5-325 MG PO TABS: 1.0000 | ORAL_TABLET | Freq: Four times a day (QID) | ORAL | Status: DC | PRN

## 2015-12-28 NOTE — Telephone Encounter (Signed)
Pt states she has a fracture, and feels she needs more than rest, ice and elevation for her comfort.  Dr. Charlsie Merles prescribed Hydrocodone 3/325mg  #30 one tablet every 6 hours.  I informed pt she would need to pick the rx up in the [place] office.

## 2016-01-10 ENCOUNTER — Ambulatory Visit (INDEPENDENT_AMBULATORY_CARE_PROVIDER_SITE_OTHER): Payer: Federal, State, Local not specified - PPO

## 2016-01-10 ENCOUNTER — Encounter: Payer: Self-pay | Admitting: Podiatry

## 2016-01-10 ENCOUNTER — Ambulatory Visit (INDEPENDENT_AMBULATORY_CARE_PROVIDER_SITE_OTHER): Payer: Federal, State, Local not specified - PPO | Admitting: Podiatry

## 2016-01-10 VITALS — BP 143/80 | HR 76 | Resp 16

## 2016-01-10 DIAGNOSIS — M205X1 Other deformities of toe(s) (acquired), right foot: Secondary | ICD-10-CM

## 2016-01-10 DIAGNOSIS — Z9889 Other specified postprocedural states: Secondary | ICD-10-CM

## 2016-01-10 DIAGNOSIS — M84374D Stress fracture, right foot, subsequent encounter for fracture with routine healing: Secondary | ICD-10-CM

## 2016-01-10 NOTE — Progress Notes (Signed)
She presents today for a follow-up of her initial surgery Keller arthroplasty single silicone implant right foot date of surgery 10/19/2015. We ultimately revised the tendon repair. She states that her foot is still considerably painful particularly in the Darco shoe. The last time she was again we found a crack in the third metatarsal head.  Objective: Vital signs are stable she is alert and oriented 3. Pulses are palpable. She has good range of motion of the first metatarsophalangeal joint but scar tissue was holding down the extensor tendon which was released I feel well allow for greater range of motion. Currently the range of motion is very free and easy at the level of the metatarsophalangeal joint was the scar tissue is broken down and resolved that she will be doing much better. Radiographs confirmed today we will place [facility] arthroplasty with a single silicone implant and the third metatarsal head appears to be healing nicely it is healing without bone callus.  Assessment: Well-healing surgical foot right.  Plan: I've encouraged her to get back into her regular routine into her regular shoe gear at this point as long as she can fit her foot and it without compromise. I will follow-up with her in a month to 6 weeks she will certainly call us sooner if necessary.

## 2016-01-22 NOTE — Progress Notes (Unsigned)
Patient ID: Sara Patterson, female   DOB: Feb 04, 1956, 60 y.o.   MRN: [id number] Dr Al CorpusHyatt performed a Primary Repair extensor hallucis longus tendon right foot on 11/07/16 [facility]

## 2016-01-31 ENCOUNTER — Ambulatory Visit (INDEPENDENT_AMBULATORY_CARE_PROVIDER_SITE_OTHER): Payer: Federal, State, Local not specified - PPO

## 2016-01-31 ENCOUNTER — Ambulatory Visit (INDEPENDENT_AMBULATORY_CARE_PROVIDER_SITE_OTHER): Payer: Federal, State, Local not specified - PPO | Admitting: Podiatry

## 2016-01-31 ENCOUNTER — Encounter: Payer: Self-pay | Admitting: Podiatry

## 2016-01-31 VITALS — BP 134/86 | HR 69 | Resp 12

## 2016-01-31 DIAGNOSIS — Z9889 Other specified postprocedural states: Secondary | ICD-10-CM | POA: Diagnosis not present

## 2016-01-31 DIAGNOSIS — M84374D Stress fracture, right foot, subsequent encounter for fracture with routine healing: Secondary | ICD-10-CM

## 2016-01-31 DIAGNOSIS — M205X1 Other deformities of toe(s) (acquired), right foot: Secondary | ICD-10-CM | POA: Diagnosis not present

## 2016-01-31 MED ORDER — METHYLPREDNISOLONE 4 MG PO TBPK: ORAL_TABLET | ORAL | Status: AC

## 2016-02-01 NOTE — Progress Notes (Signed)
Dr. Ala DachFord presents today for follow-up of some swelling and tenderness to the dorsal aspect of the right foot status post Lorenz CoasterKeller arthroplasty with a single silicone implant as well as a repair of her extensor hallucis longus tendon. She states that she is able to better move her toe with her muscle now however she still has tenderness within the first metatarsophalangeal joint which is resulting in pain to the lateral aspect of the foot.  Objective: Vital signs are stable alert and oriented 3. Pulses are palpable. She has good range of motion of the first metatarsophalangeal joint that she has tenderness on palpation of the lesser metatarsals. Radiographs confirm well-healing surgical foot with no other abnormalities.  Assessment: Well-healing surgical foot right.  Plan: Secondary to the stiffness and continued soreness and compensation I am requesting that she follow-up with physical therapy to increase range of motion and decrease her symptoms. I will follow-up once she is complete with them.

## 2016-02-05 ENCOUNTER — Telehealth: Payer: Self-pay | Admitting: *Deleted

## 2016-02-05 NOTE — Telephone Encounter (Addendum)
Sara Patterson - Physical Therapy asked if there are any restrictions or precautions for this pt's PT.  02/06/2016-Left message informing Sara Patterson of Dr. Geryl RankinsHyatt's statement she should be good to perform the ordered PT without restrictions or precautions.

## 2016-02-06 NOTE — Telephone Encounter (Signed)
No she should be good.

## 2016-02-07 ENCOUNTER — Ambulatory Visit: Payer: Federal, State, Local not specified - PPO | Admitting: Podiatry
